# Patient Record
Sex: Female | Born: 1980 | Race: White | Hispanic: No | Marital: Married | Smoking: Never smoker
Health system: Southern US, Community
[De-identification: ages and names within clinical notes are randomized; demographics above are authoritative.]

## PROBLEM LIST (undated history)

## (undated) DIAGNOSIS — Z9882 Breast implant status: Secondary | ICD-10-CM

## (undated) DIAGNOSIS — N189 Chronic kidney disease, unspecified: Secondary | ICD-10-CM

## (undated) DIAGNOSIS — R87610 Atypical squamous cells of undetermined significance on cytologic smear of cervix (ASC-US): Secondary | ICD-10-CM

## (undated) DIAGNOSIS — N200 Calculus of kidney: Secondary | ICD-10-CM

## (undated) DIAGNOSIS — Z9889 Other specified postprocedural states: Secondary | ICD-10-CM

## (undated) HISTORY — PX: BREAST ENHANCEMENT SURGERY: SHX7

## (undated) HISTORY — PX: LITHOTRIPSY: SUR834

---

## 2001-10-20 ENCOUNTER — Other Ambulatory Visit: Admission: RE | Admit: 2001-10-20 | Discharge: 2001-10-20 | Payer: Self-pay | Admitting: Obstetrics and Gynecology

## 2002-11-14 ENCOUNTER — Other Ambulatory Visit: Admission: RE | Admit: 2002-11-14 | Discharge: 2002-11-14 | Payer: Self-pay | Admitting: Obstetrics and Gynecology

## 2004-03-25 ENCOUNTER — Other Ambulatory Visit: Admission: RE | Admit: 2004-03-25 | Discharge: 2004-03-25 | Payer: Self-pay | Admitting: Obstetrics and Gynecology

## 2005-04-22 ENCOUNTER — Other Ambulatory Visit: Admission: RE | Admit: 2005-04-22 | Discharge: 2005-04-22 | Payer: Self-pay | Admitting: Obstetrics and Gynecology

## 2008-04-14 DIAGNOSIS — R87610 Atypical squamous cells of undetermined significance on cytologic smear of cervix (ASC-US): Secondary | ICD-10-CM

## 2008-04-14 HISTORY — PX: OTHER SURGICAL HISTORY: SHX169

## 2008-04-14 HISTORY — DX: Atypical squamous cells of undetermined significance on cytologic smear of cervix (ASC-US): R87.610

## 2008-06-22 ENCOUNTER — Ambulatory Visit: Payer: Self-pay | Admitting: Advanced Practice Midwife

## 2008-06-22 ENCOUNTER — Inpatient Hospital Stay (HOSPITAL_COMMUNITY): Admission: AD | Admit: 2008-06-22 | Discharge: 2008-06-22 | Payer: Self-pay | Admitting: Obstetrics and Gynecology

## 2008-07-06 ENCOUNTER — Inpatient Hospital Stay (HOSPITAL_COMMUNITY): Admission: AD | Admit: 2008-07-06 | Discharge: 2008-07-06 | Payer: Self-pay | Admitting: Obstetrics and Gynecology

## 2008-07-06 ENCOUNTER — Inpatient Hospital Stay (HOSPITAL_COMMUNITY): Admission: AD | Admit: 2008-07-06 | Discharge: 2008-07-09 | Payer: Self-pay | Admitting: Obstetrics and Gynecology

## 2008-08-28 ENCOUNTER — Encounter: Admission: RE | Admit: 2008-08-28 | Discharge: 2008-08-28 | Payer: Self-pay | Admitting: Obstetrics and Gynecology

## 2009-01-11 ENCOUNTER — Ambulatory Visit (HOSPITAL_COMMUNITY): Admission: RE | Admit: 2009-01-11 | Discharge: 2009-01-11 | Payer: Self-pay | Admitting: Urology

## 2010-07-25 LAB — CBC
MCHC: 33.8 g/dL (ref 30.0–36.0)
MCV: 91.1 fL (ref 78.0–100.0)
Platelets: 153 10*3/uL (ref 150–400)
Platelets: 178 10*3/uL (ref 150–400)
RDW: 13.5 % (ref 11.5–15.5)
RDW: 13.9 % (ref 11.5–15.5)

## 2010-07-25 LAB — RPR: RPR Ser Ql: NONREACTIVE

## 2010-11-19 ENCOUNTER — Other Ambulatory Visit (HOSPITAL_COMMUNITY): Payer: Self-pay | Admitting: Urology

## 2010-11-19 DIAGNOSIS — N3020 Other chronic cystitis without hematuria: Secondary | ICD-10-CM

## 2010-11-25 ENCOUNTER — Other Ambulatory Visit (HOSPITAL_COMMUNITY): Payer: Self-pay

## 2010-12-22 IMAGING — CR DG ABDOMEN 1V
1 series · 1 of 1 positions shown · non-contrast
Comparison: None.

CLINICAL DATA: 28-year-old female with right renal stone and
planned lithotripsy.

ABDOMEN - 1 VIEW

[t abdomen supine]
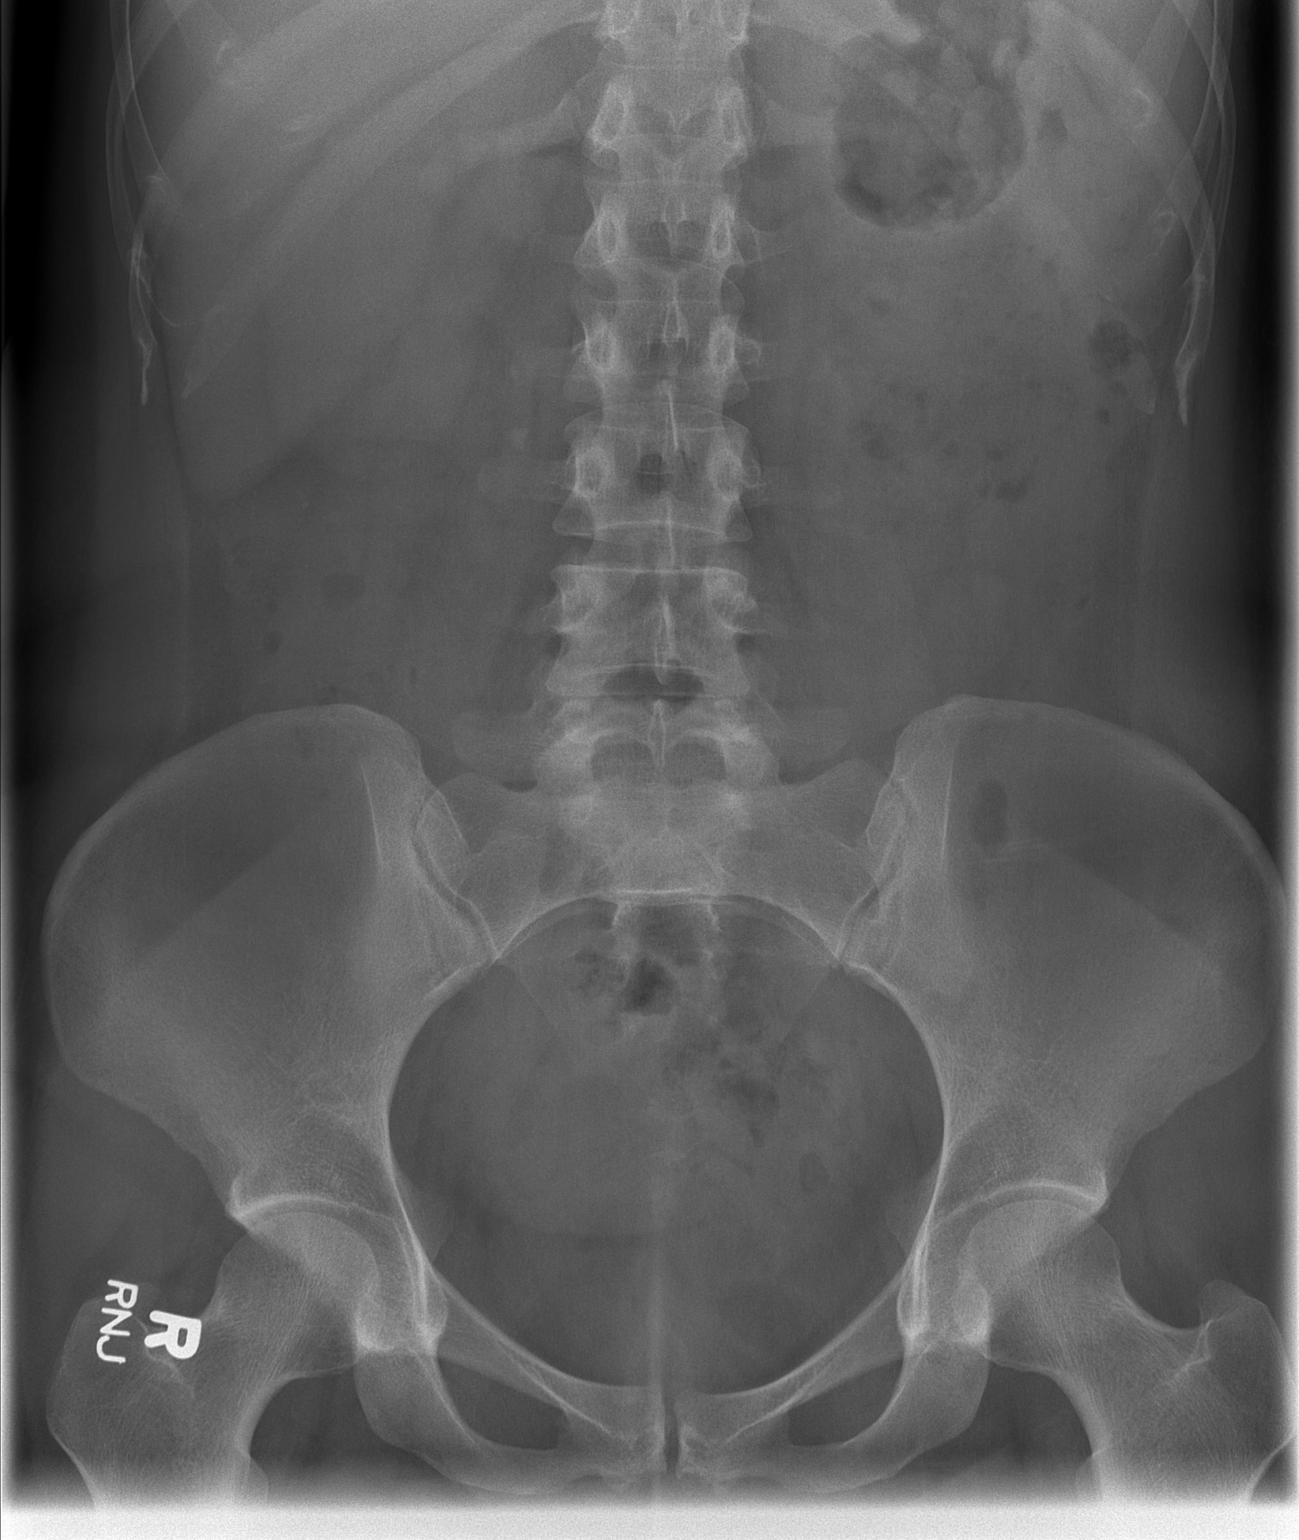

[1 of 1 positions shown; findings below may reference images not displayed]

FINDINGS: AP view of the lower abdomen pelvis.  There is a 5 mm
calcific focus along the course of the right proximal ureter
projecting at the L2-L3 level.  Left renal shadow not well
visualized.  No definite calculus projecting over the right renal
shadow. Visualized bowel gas pattern is nonobstructed.  No acute
osseous abnormality identified.
IMPRESSION: 5 mm proximal right ureteral calculus suspected, at the L2-L3
level.

## 2012-02-10 ENCOUNTER — Other Ambulatory Visit: Payer: Self-pay | Admitting: Urology

## 2012-02-11 ENCOUNTER — Encounter (HOSPITAL_COMMUNITY): Payer: Self-pay | Admitting: Pharmacy Technician

## 2012-02-11 ENCOUNTER — Encounter (HOSPITAL_COMMUNITY): Payer: Self-pay | Admitting: *Deleted

## 2012-02-13 NOTE — H&P (Signed)
History of Present Illness  Meredith Gillespie presents today as an acute work an appointment. Patient has a known 6 mm right renal calculus. This has increased in size and was recently reassessed several weeks ago. We talked her at that time about elective lithotripsy which she is considering. She is now coming in with some intermittent discomfort on that side as well as some intermittent gross hematuria.   Surgical History Problems  1. History of  Lithotripsy  Current Meds 1. Fluticasone Propionate 50 MCG/ACT Nasal Suspension; Therapy: 03Sep2013 to 2. Hydrocodone-Acetaminophen 5-500 MG Oral Tablet; 1-2 TABS PO Q 4-6H PRN PAIN; Therapy:  16Apr2013 to (Last Rx:16Apr2013)  Allergies Medication  1. No Known Drug Allergies  Family History Problems  1. Maternal grandfather's history of  Acute Myocardial Infarction V17.3 2. Maternal grandfather's history of  Chronic Renal Failure 3. Maternal aunt's history of  Colon Cancer V16.0 4. Family history of  Family Health Status Number Of Children 1 daughter (07-07-2008) 5. Paternal history of  Gout V18.19 6. Maternal grandfather's history of  Hypertension V17.49 7. Maternal grandfather's history of  Ischemic Stroke V17.1 8. Paternal history of  Nephrolithiasis 9. Maternal grandmother's history of  Urologic Disorder V18.7 kidney stons, blood in urine  Social History Problems  1. Activities Of Daily Living 2. Alcohol Use 0-1 3. Caffeine Use 3 4. Exercise Habits Has just met with a personal trainer and will be exercising at the gym a couple of times a week and he is doing Zumba once a week.She will be working with a Systems analyst a couple of times a week and does Zumba 1x a weekf about once a week 5. Living Independently With Spouse 6. Marital History - Currently Married 7. Never A Smoker 8. Occupation: Social research officer, government 9. Self-reliant In Usual Daily Activities Denied  10. Tobacco Use  Review of Systems Genitourinary, constitutional, skin,  eye, otolaryngeal, hematologic/lymphatic, cardiovascular, pulmonary, endocrine, musculoskeletal, gastrointestinal, neurological and psychiatric system(s) were reviewed and pertinent findings if present are noted.  Genitourinary: incontinence and hematuria.  Gastrointestinal: nausea and abdominal pain, but no flank pain.  Neurological: headache.    Vitals Vital Signs [Data Includes: Last 1 Day]  25Oct2013 10:57AM  Blood Pressure: 114 / 76 Temperature: 98.4 F Heart Rate: 75  Physical Exam Constitutional: Well nourished and well developed . No acute distress.  Pulmonary: No respiratory distress and normal respiratory rhythm and effort.  Cardiovascular: Heart rate and rhythm are normal . No peripheral edema.  Abdomen: The abdomen is soft and nontender. No masses are palpated. No CVA tenderness. No hernias are palpable. No hepatosplenomegaly noted.  Skin: Normal skin turgor, no visible rash and no visible skin lesions.    Results/Data Urine [Data Includes: Last 1 Day]   25Oct2013  COLOR YELLOW   APPEARANCE CLEAR   SPECIFIC GRAVITY <1.005   pH 6.5   GLUCOSE NEG mg/dL  BILIRUBIN NEG   KETONE NEG mg/dL  BLOOD LARGE   PROTEIN NEG mg/dL  UROBILINOGEN 0.2 mg/dL  NITRITE NEG   LEUKOCYTE ESTERASE NEG   SQUAMOUS EPITHELIAL/HPF NONE SEEN   WBC NONE SEEN WBC/hpf  RBC 3-6 RBC/hpf  BACTERIA NONE SEEN   CRYSTALS NONE SEEN   CASTS NONE SEEN     KUB was obtained today. This does show migration of her stone into the proximal ureter. On measurement today it appears to be 4 x 6 mm really in the early part of the proximal ureter.   Assessment Assessed  1. Nephrolithiasis 592.0 2. Ureteral Stone 592.1  Plan Health Maintenance (V70.0)  1. UA With REFLEX  Done: 25Oct2013 10:50AM Nephrolithiasis (592.0)  2. KUB  Done: 25Oct2013 12:00AM  Discussion/Summary  Meredith Gillespie has a 4 x 6 mm proximal right ureteral calculus. This is her known stone that has migrated. She is told that she has  approximately a 5050 chance of spontaneous passage. There is early no indication for acute intervention at this time. She is interested in attempts at spontaneous passage. She does have pain medication at home. We will also get her started on tamsulosin. We will have her follow up with a physician extenders in about 2 weeks for a KUB. If treatment is necessary I would recommend ESWL of the stone remains in relatively the same position. If the stone does migrate more distally and is more difficult to visualize and ureteroscopy may be necessary.

## 2012-02-16 ENCOUNTER — Ambulatory Visit (HOSPITAL_COMMUNITY)
Admission: RE | Admit: 2012-02-16 | Discharge: 2012-02-16 | Disposition: A | Discharge status: Home / Self Care | Payer: BC Managed Care – PPO | Source: Ambulatory Visit | Attending: Urology | Admitting: Urology

## 2012-02-16 ENCOUNTER — Ambulatory Visit (HOSPITAL_COMMUNITY): Payer: BC Managed Care – PPO

## 2012-02-16 ENCOUNTER — Encounter (HOSPITAL_COMMUNITY)
Admission: RE | Disposition: A | Discharge status: Home / Self Care | Payer: Self-pay | Source: Ambulatory Visit | Attending: Urology

## 2012-02-16 DIAGNOSIS — N200 Calculus of kidney: Secondary | ICD-10-CM | POA: Insufficient documentation

## 2012-02-16 DIAGNOSIS — N201 Calculus of ureter: Secondary | ICD-10-CM

## 2012-02-16 HISTORY — DX: Chronic kidney disease, unspecified: N18.9

## 2012-02-16 SURGERY — LITHOTRIPSY, ESWL
Anesthesia: LOCAL | Laterality: Right

## 2012-02-16 MED ORDER — DIPHENHYDRAMINE HCL 25 MG PO CAPS
25.0000 mg | ORAL_CAPSULE | ORAL | Status: AC
  Administered 2012-02-16: 25 mg via ORAL
  Filled 2012-02-16: qty 1

## 2012-02-16 MED ORDER — CIPROFLOXACIN IN D5W 400 MG/200ML IV SOLN
400.0000 mg | INTRAVENOUS | Status: AC
  Administered 2012-02-16: 400 mg via INTRAVENOUS
  Filled 2012-02-16: qty 200

## 2012-02-16 MED ORDER — DEXTROSE-NACL 5-0.45 % IV SOLN
INTRAVENOUS | Status: DC
  Administered 2012-02-16: 08:00:00 via INTRAVENOUS

## 2012-02-16 MED ORDER — DIAZEPAM 5 MG PO TABS
10.0000 mg | ORAL_TABLET | ORAL | Status: AC
  Administered 2012-02-16: 10 mg via ORAL
  Filled 2012-02-16: qty 2

## 2012-02-16 NOTE — Interval H&P Note (Signed)
History and Physical Interval Note:  02/16/2012 8:24 AM  Meredith Gillespie  has presented today for surgery, with the diagnosis of Right Ureteral Calculus  The various methods of treatment have been discussed with the patient and family. After consideration of risks, benefits and other options for treatment, the patient has consented to  Procedure(s) (LRB) with comments: EXTRACORPOREAL SHOCK WAVE LITHOTRIPSY (ESWL) (Right) as a surgical intervention .  The patient's history has been reviewed, patient examined, no change in status, stable for surgery.  I have reviewed the patient's chart and labs.  Questions were answered to the patient's satisfaction.     [name]

## 2012-02-16 NOTE — Op Note (Signed)
See [facility] OP note scanned into chart. 

## 2012-02-27 ENCOUNTER — Other Ambulatory Visit: Payer: Self-pay | Admitting: Obstetrics and Gynecology

## 2012-02-27 DIAGNOSIS — Z9882 Breast implant status: Secondary | ICD-10-CM

## 2012-02-27 DIAGNOSIS — N644 Mastodynia: Secondary | ICD-10-CM

## 2012-03-03 ENCOUNTER — Ambulatory Visit
Admission: RE | Admit: 2012-03-03 | Discharge: 2012-03-03 | Disposition: A | Discharge status: Home / Self Care | Payer: BC Managed Care – PPO | Source: Ambulatory Visit | Attending: Obstetrics and Gynecology | Admitting: Obstetrics and Gynecology

## 2012-03-03 ENCOUNTER — Other Ambulatory Visit: Payer: Self-pay | Admitting: Obstetrics and Gynecology

## 2012-03-03 DIAGNOSIS — Z9882 Breast implant status: Secondary | ICD-10-CM

## 2012-03-03 DIAGNOSIS — N644 Mastodynia: Secondary | ICD-10-CM

## 2013-10-04 LAB — OB RESULTS CONSOLE RUBELLA ANTIBODY, IGM: Rubella: IMMUNE

## 2013-10-04 LAB — OB RESULTS CONSOLE ABO/RH: RH Type: POSITIVE

## 2013-10-04 LAB — OB RESULTS CONSOLE HIV ANTIBODY (ROUTINE TESTING): HIV: NONREACTIVE

## 2013-10-04 LAB — OB RESULTS CONSOLE GC/CHLAMYDIA
Chlamydia: NEGATIVE
GC PROBE AMP, GENITAL: NEGATIVE

## 2013-10-04 LAB — OB RESULTS CONSOLE ANTIBODY SCREEN: Antibody Screen: NEGATIVE

## 2013-10-04 LAB — OB RESULTS CONSOLE RPR: RPR: NONREACTIVE

## 2013-10-04 LAB — OB RESULTS CONSOLE HEPATITIS B SURFACE ANTIGEN: HEP B S AG: NEGATIVE

## 2014-01-26 IMAGING — CR DG ABDOMEN 1V
1 series · 1 of 1 positions shown · non-contrast
Comparison: 02/06/2012

CLINICAL DATA: Right renal stone, pre lithotripsy

ABDOMEN - 1 VIEW

[t abdomen supine]
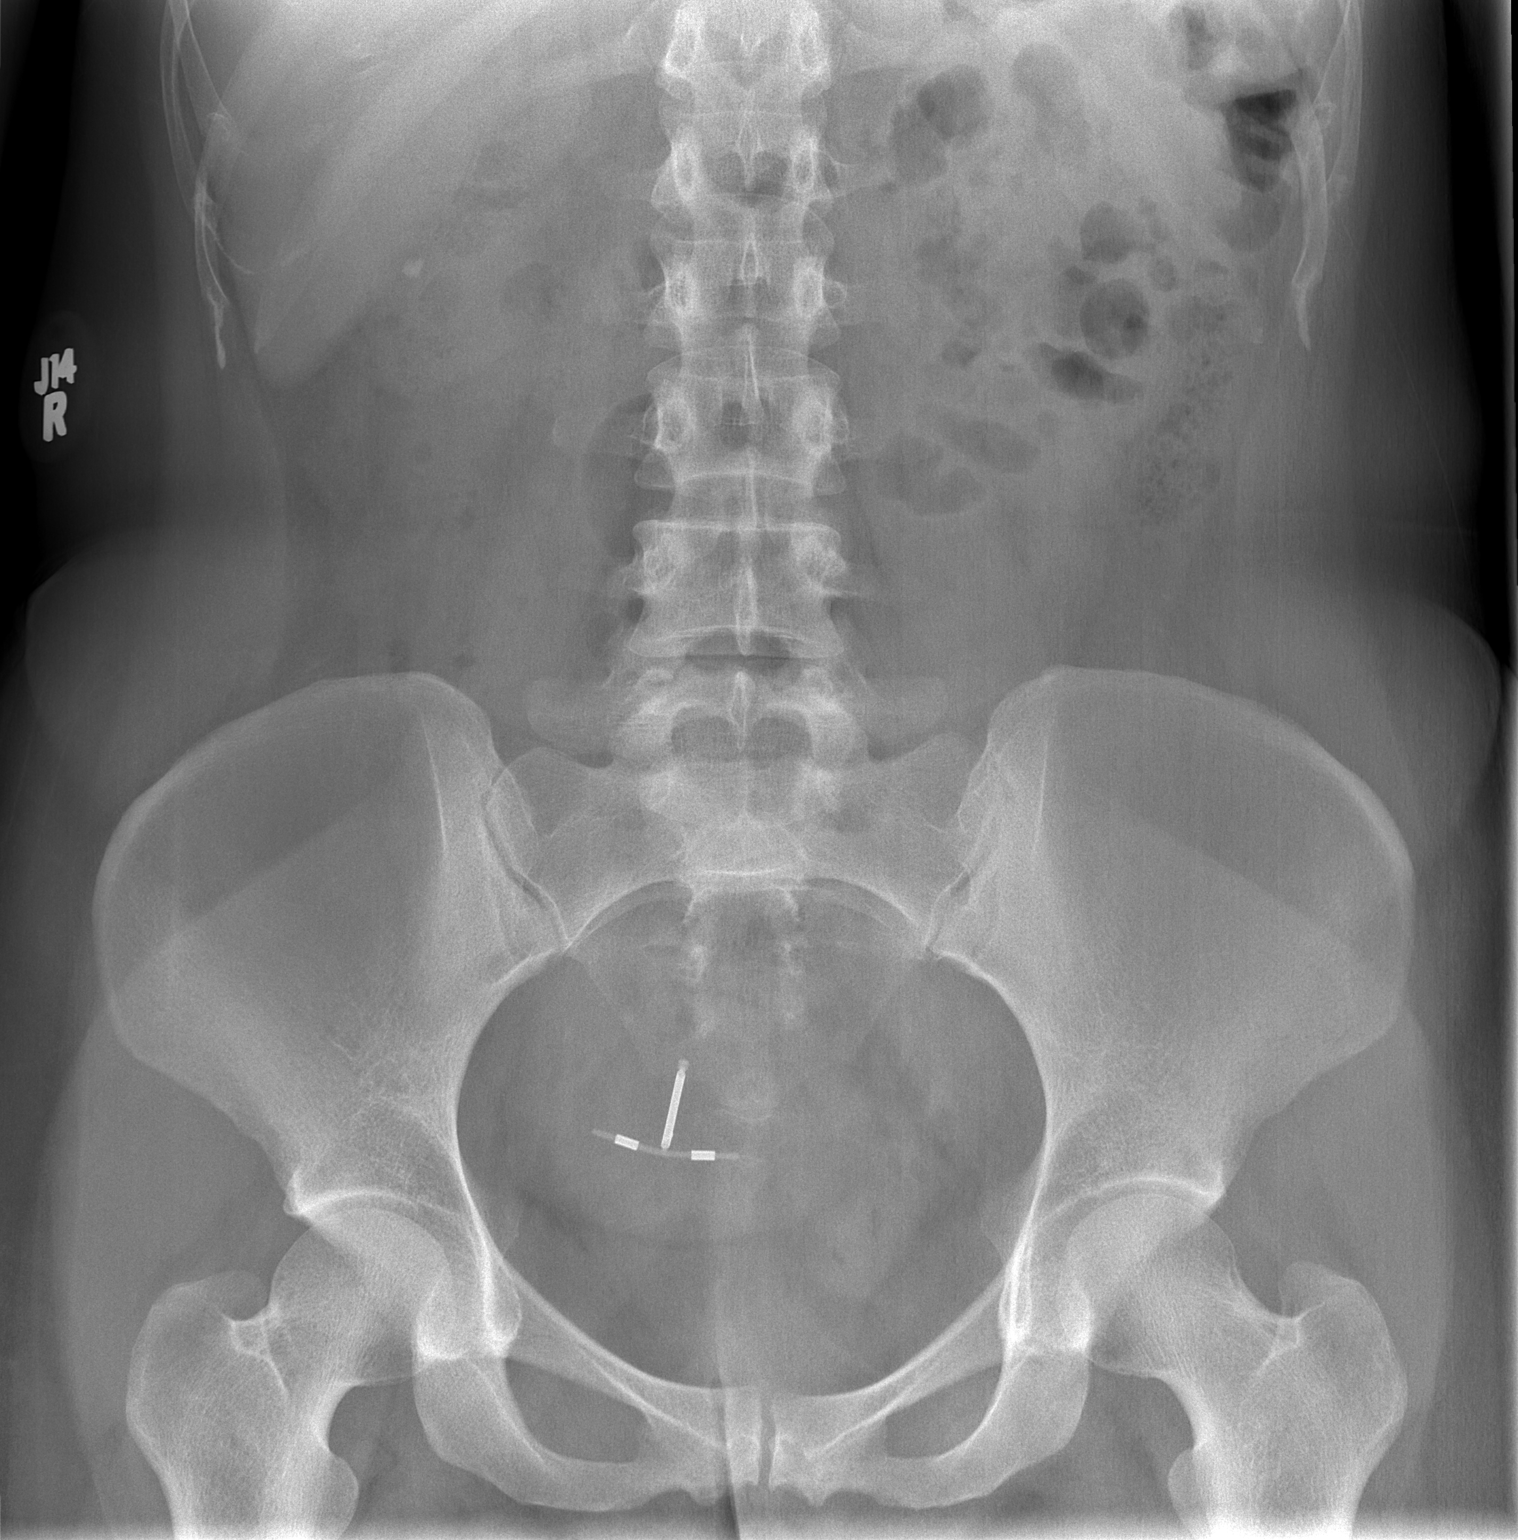

[1 of 1 positions shown; findings below may reference images not displayed]

FINDINGS: 6 mm stone projects over the right renal shadow.  Bowel
gas pattern nonobstructive.  Hemidiaphragms excluded from the
image.  IUD projects over the pelvis.  No acute osseous finding.
IMPRESSION: 6 mm stone projects over the right renal shadow.

## 2014-03-10 ENCOUNTER — Inpatient Hospital Stay (HOSPITAL_COMMUNITY)
Admission: AD | Admit: 2014-03-10 | Discharge: 2014-03-10 | Disposition: A | Discharge status: Home / Self Care | Payer: Managed Care, Other (non HMO) | Source: Ambulatory Visit | Attending: Obstetrics and Gynecology | Admitting: Obstetrics and Gynecology

## 2014-03-10 ENCOUNTER — Encounter (HOSPITAL_COMMUNITY): Payer: Self-pay | Admitting: *Deleted

## 2014-03-10 DIAGNOSIS — O368130 Decreased fetal movements, third trimester, not applicable or unspecified: Secondary | ICD-10-CM

## 2014-03-10 DIAGNOSIS — Z3A30 30 weeks gestation of pregnancy: Secondary | ICD-10-CM | POA: Diagnosis not present

## 2014-03-10 DIAGNOSIS — O368123 Decreased fetal movements, second trimester, fetus 3: Secondary | ICD-10-CM | POA: Diagnosis present

## 2014-03-10 DIAGNOSIS — O368132 Decreased fetal movements, third trimester, fetus 2: Secondary | ICD-10-CM

## 2014-03-10 HISTORY — DX: Calculus of kidney: N20.0

## 2014-03-10 LAB — URINALYSIS, ROUTINE W REFLEX MICROSCOPIC
BILIRUBIN URINE: NEGATIVE
GLUCOSE, UA: NEGATIVE mg/dL
Ketones, ur: NEGATIVE mg/dL
Leukocytes, UA: NEGATIVE
NITRITE: NEGATIVE
PH: 6 (ref 5.0–8.0)
Protein, ur: NEGATIVE mg/dL
SPECIFIC GRAVITY, URINE: 1.02 (ref 1.005–1.030)
Urobilinogen, UA: 0.2 mg/dL (ref 0.0–1.0)

## 2014-03-10 LAB — URINE MICROSCOPIC-ADD ON

## 2014-03-10 NOTE — MAU Note (Signed)
Bleeding after intercourse, small amt of pinkish fluid.  (no hx of low lying or previa) Was told she was anemic last wk, has not started her iron yet.  Pt is feeling weak and dizzy.  Decreased fetal movement noted today..Marland Kitchen

## 2014-03-10 NOTE — Discharge Instructions (Signed)
Fetal Movement Counts °Patient Name: __________________________________________________ Patient Due Date: ____________________ °Performing a fetal movement count is highly recommended in high-risk pregnancies, but it is good for every pregnant woman to do. Your health care provider may ask you to start counting fetal movements at 28 weeks of the pregnancy. Fetal movements often increase: °· After eating a full meal. °· After physical activity. °· After eating or drinking something sweet or cold. °· At rest. °Pay attention to when you feel the baby is most active. This will help you notice a pattern of your baby's sleep and wake cycles and what factors contribute to an increase in fetal movement. It is important to perform a fetal movement count at the same time each day when your baby is normally most active.  °HOW TO COUNT FETAL MOVEMENTS °· Find a quiet and comfortable area to sit or lie down on your left side. Lying on your left side provides the best blood and oxygen circulation to your baby. °· Write down the day and time on a sheet of paper or in a journal. °· Start counting kicks, flutters, swishes, rolls, or jabs in a 2-hour period. You should feel at least 10 movements within 2 hours. °· If you do not feel 10 movements in 2 hours, wait 2-3 hours and count again. Look for a change in the pattern or not enough counts in 2 hours. °SEEK MEDICAL CARE IF: °· You feel less than 10 counts in 2 hours, tried twice. °· There is no movement in over an hour. °· The pattern is changing or taking longer each day to reach 10 counts in 2 hours. °· You feel the baby is not moving as he or she usually does. °Date: ____________ Movements: ____________ Start time: ____________ Finish time: ____________  °Date: ____________ Movements: ____________ Start time: ____________ Finish time: ____________ °Date: ____________ Movements: ____________ Start time: ____________ Finish time: ____________ °Date: ____________ Movements:  ____________ Start time: ____________ Finish time: ____________ °Date: ____________ Movements: ____________ Start time: ____________ Finish time: ____________ °Date: ____________ Movements: ____________ Start time: ____________ Finish time: ____________ °Date: ____________ Movements: ____________ Start time: ____________ Finish time: ____________ °Date: ____________ Movements: ____________ Start time: ____________ Finish time: ____________  °Date: ____________ Movements: ____________ Start time: ____________ Finish time: ____________ °Date: ____________ Movements: ____________ Start time: ____________ Finish time: ____________ °Date: ____________ Movements: ____________ Start time: ____________ Finish time: ____________ °Date: ____________ Movements: ____________ Start time: ____________ Finish time: ____________ °Date: ____________ Movements: ____________ Start time: ____________ Finish time: ____________ °Date: ____________ Movements: ____________ Start time: ____________ Finish time: ____________ °Date: ____________ Movements: ____________ Start time: ____________ Finish time: ____________  °Date: ____________ Movements: ____________ Start time: ____________ Finish time: ____________ °Date: ____________ Movements: ____________ Start time: ____________ Finish time: ____________ °Date: ____________ Movements: ____________ Start time: ____________ Finish time: ____________ °Date: ____________ Movements: ____________ Start time: ____________ Finish time: ____________ °Date: ____________ Movements: ____________ Start time: ____________ Finish time: ____________ °Date: ____________ Movements: ____________ Start time: ____________ Finish time: ____________ °Date: ____________ Movements: ____________ Start time: ____________ Finish time: ____________  °Date: ____________ Movements: ____________ Start time: ____________ Finish time: ____________ °Date: ____________ Movements: ____________ Start time: ____________ Finish  time: ____________ °Date: ____________ Movements: ____________ Start time: ____________ Finish time: ____________ °Date: ____________ Movements: ____________ Start time: ____________ Finish time: ____________ °Date: ____________ Movements: ____________ Start time: ____________ Finish time: ____________ °Date: ____________ Movements: ____________ Start time: ____________ Finish time: ____________ °Date: ____________ Movements: ____________ Start time: ____________ Finish time: ____________  °Date: ____________ Movements: ____________ Start time: ____________ Finish   time: ____________ °Date: ____________ Movements: ____________ Start time: ____________ Finish time: ____________ °Date: ____________ Movements: ____________ Start time: ____________ Finish time: ____________ °Date: ____________ Movements: ____________ Start time: ____________ Finish time: ____________ °Date: ____________ Movements: ____________ Start time: ____________ Finish time: ____________ °Date: ____________ Movements: ____________ Start time: ____________ Finish time: ____________ °Date: ____________ Movements: ____________ Start time: ____________ Finish time: ____________  °Date: ____________ Movements: ____________ Start time: ____________ Finish time: ____________ °Date: ____________ Movements: ____________ Start time: ____________ Finish time: ____________ °Date: ____________ Movements: ____________ Start time: ____________ Finish time: ____________ °Date: ____________ Movements: ____________ Start time: ____________ Finish time: ____________ °Date: ____________ Movements: ____________ Start time: ____________ Finish time: ____________ °Date: ____________ Movements: ____________ Start time: ____________ Finish time: ____________ °Date: ____________ Movements: ____________ Start time: ____________ Finish time: ____________  °Date: ____________ Movements: ____________ Start time: ____________ Finish time: ____________ °Date: ____________  Movements: ____________ Start time: ____________ Finish time: ____________ °Date: ____________ Movements: ____________ Start time: ____________ Finish time: ____________ °Date: ____________ Movements: ____________ Start time: ____________ Finish time: ____________ °Date: ____________ Movements: ____________ Start time: ____________ Finish time: ____________ °Date: ____________ Movements: ____________ Start time: ____________ Finish time: ____________ °Date: ____________ Movements: ____________ Start time: ____________ Finish time: ____________  °Date: ____________ Movements: ____________ Start time: ____________ Finish time: ____________ °Date: ____________ Movements: ____________ Start time: ____________ Finish time: ____________ °Date: ____________ Movements: ____________ Start time: ____________ Finish time: ____________ °Date: ____________ Movements: ____________ Start time: ____________ Finish time: ____________ °Date: ____________ Movements: ____________ Start time: ____________ Finish time: ____________ °Date: ____________ Movements: ____________ Start time: ____________ Finish time: ____________ °Document Released: 04/30/2006 Document Revised: 08/15/2013 Document Reviewed: 01/26/2012 °[employer]® Patient Information ©2015 [employer]. This information is not intended to replace advice given to you by your health care provider. Make sure you discuss any questions you have with your health care provider. °Third Trimester of Pregnancy °The third trimester is from week 29 through week 42, months 7 through 9. The third trimester is a time when the fetus is growing rapidly. At the end of the ninth month, the fetus is about 20 inches in length and weighs 6-10 pounds.  °BODY CHANGES °Your body goes through many changes during pregnancy. The changes vary from woman to woman.  °· Your weight will continue to increase. You can expect to gain 25-35 pounds (11-16 kg) by the end of the pregnancy. °· You may begin to get  stretch marks on your hips, abdomen, and breasts. °· You may urinate more often because the fetus is moving lower into your pelvis and pressing on your bladder. °· You may develop or continue to have heartburn as a result of your pregnancy. °· You may develop constipation because certain hormones are causing the muscles that push waste through your intestines to slow down. °· You may develop hemorrhoids or swollen, bulging veins (varicose veins). °· You may have pelvic pain because of the weight gain and pregnancy hormones relaxing your joints between the bones in your pelvis. Backaches may result from overexertion of the muscles supporting your posture. °· You may have changes in your hair. These can include thickening of your hair, rapid growth, and changes in texture. Some women also have hair loss during or after pregnancy, or hair that feels dry or thin. Your hair will most likely return to normal after your baby is born. °· Your breasts will continue to grow and be tender. A yellow discharge may leak from your breasts called colostrum. °· Your belly button may stick out. °· You may feel short of   breath because of your expanding uterus. °· You may notice the fetus "dropping," or moving lower in your abdomen. °· You may have a bloody mucus discharge. This usually occurs a few days to a week before labor begins. °· Your cervix becomes thin and soft (effaced) near your due date. °WHAT TO EXPECT AT YOUR PRENATAL EXAMS  °You will have prenatal exams every 2 weeks until week 36. Then, you will have weekly prenatal exams. During a routine prenatal visit: °· You will be weighed to make sure you and the fetus are growing normally. °· Your blood pressure is taken. °· Your abdomen will be measured to track your baby's growth. °· The fetal heartbeat will be listened to. °· Any test results from the previous visit will be discussed. °· You may have a cervical check near your due date to see if you have effaced. °At around  36 weeks, your caregiver will check your cervix. At the same time, your caregiver will also perform a test on the secretions of the vaginal tissue. This test is to determine if a type of bacteria, Group B streptococcus, is present. Your caregiver will explain this further. °Your caregiver may ask you: °· What your birth plan is. °· How you are feeling. °· If you are feeling the baby move. °· If you have had any abnormal symptoms, such as leaking fluid, bleeding, severe headaches, or abdominal cramping. °· If you have any questions. °Other tests or screenings that may be performed during your third trimester include: °· Blood tests that check for low iron levels (anemia). °· Fetal testing to check the health, activity level, and growth of the fetus. Testing is done if you have certain medical conditions or if there are problems during the pregnancy. °FALSE LABOR °You may feel small, irregular contractions that eventually go away. These are called Braxton Hicks contractions, or false labor. Contractions may last for hours, days, or even weeks before true labor sets in. If contractions come at regular intervals, intensify, or become painful, it is best to be seen by your caregiver.  °SIGNS OF LABOR  °· Menstrual-like cramps. °· Contractions that are 5 minutes apart or less. °· Contractions that start on the top of the uterus and spread down to the lower abdomen and back. °· A sense of increased pelvic pressure or back pain. °· A watery or bloody mucus discharge that comes from the vagina. °If you have any of these signs before the 37th week of pregnancy, call your caregiver right away. You need to go to the hospital to get checked immediately. °HOME CARE INSTRUCTIONS  °· Avoid all smoking, herbs, alcohol, and unprescribed drugs. These chemicals affect the formation and growth of the baby. °· Follow your caregiver's instructions regarding medicine use. There are medicines that are either safe or unsafe to take during  pregnancy. °· Exercise only as directed by your caregiver. Experiencing uterine cramps is a good sign to stop exercising. °· Continue to eat regular, healthy meals. °· Wear a good support bra for breast tenderness. °· Do not use hot tubs, steam rooms, or saunas. °· Wear your seat belt at all times when driving. °· Avoid raw meat, uncooked cheese, cat litter boxes, and soil used by cats. These carry germs that can cause birth defects in the baby. °· Take your prenatal vitamins. °· Try taking a stool softener (if your caregiver approves) if you develop constipation. Eat more high-fiber foods, such as fresh vegetables or fruit and whole grains. Drink   plenty of fluids to keep your urine clear or pale yellow. °· Take warm sitz baths to soothe any pain or discomfort caused by hemorrhoids. Use hemorrhoid cream if your caregiver approves. °· If you develop varicose veins, wear support hose. Elevate your feet for 15 minutes, 3-4 times a day. Limit salt in your diet. °· Avoid heavy lifting, wear low heal shoes, and practice good posture. °· Rest a lot with your legs elevated if you have leg cramps or low back pain. °· Visit your dentist if you have not gone during your pregnancy. Use a soft toothbrush to brush your teeth and be gentle when you floss. °· A sexual relationship may be continued unless your caregiver directs you otherwise. °· Do not travel far distances unless it is absolutely necessary and only with the approval of your caregiver. °· Take prenatal classes to understand, practice, and ask questions about the labor and delivery. °· Make a trial run to the hospital. °· Pack your hospital bag. °· Prepare the baby's nursery. °· Continue to go to all your prenatal visits as directed by your caregiver. °SEEK MEDICAL CARE IF: °· You are unsure if you are in labor or if your water has broken. °· You have dizziness. °· You have mild pelvic cramps, pelvic pressure, or nagging pain in your abdominal area. °· You have  persistent nausea, vomiting, or diarrhea. °· You have a bad smelling vaginal discharge. °· You have pain with urination. °SEEK IMMEDIATE MEDICAL CARE IF:  °· You have a fever. °· You are leaking fluid from your vagina. °· You have spotting or bleeding from your vagina. °· You have severe abdominal cramping or pain. °· You have rapid weight loss or gain. °· You have shortness of breath with chest pain. °· You notice sudden or extreme swelling of your face, hands, ankles, feet, or legs. °· You have not felt your baby move in over an hour. °· You have severe headaches that do not go away with medicine. °· You have vision changes. °Document Released: 03/25/2001 Document Revised: 04/05/2013 Document Reviewed: 06/01/2012 °[employer]® Patient Information ©2015 [employer]. This information is not intended to replace advice given to you by your health care provider. Make sure you discuss any questions you have with your health care provider. ° ° °

## 2014-03-10 NOTE — MAU Provider Note (Signed)
  History     CSN: [id number]  Arrival date and time: 03/10/14 1234   First Provider Initiated Contact with Patient 03/10/14 1357      Chief Complaint  Patient presents with  . Decreased Fetal Movement   HPI Comments: Meredith Gillespie 33 y.o. G2P1 6834w2d presenst to MAU with several complaints including decreased fetal movement, pinks vaginal discharge and feeling tired. She had intercourse last night and this morning noticed discharge. She does not have any issues with her placenta. She has a history of anemia and this week was put on iron tablets.      Past Medical History  Diagnosis Date  . Chronic kidney disease     kidney stones  . Kidney stones     Past Surgical History  Procedure Laterality Date  . Breast enhancement surgery    . Childbirth  2010  . Lithotripsy  2000, 2010    Family History  Problem Relation Age of Onset  . Cancer Maternal Aunt     History  Substance Use Topics  . Smoking status: Never Smoker   . Smokeless tobacco: Not on file  . Alcohol Use: Yes    Allergies: No Known Allergies  Prescriptions prior to admission  Medication Sig Dispense Refill Last Dose  . acetaminophen (TYLENOL) 500 MG tablet Take 1,000 mg by mouth every 6 (six) hours as needed for headache.   Past Week at Unknown time  . calcium carbonate (TUMS - DOSED IN MG ELEMENTAL CALCIUM) 500 MG chewable tablet Chew 2 tablets by mouth 2 (two) times daily as needed for indigestion or heartburn.   Past Week at Unknown time  . FERROUS SULFATE PO Take 1 tablet by mouth daily.   03/10/2014 at Unknown time  . Prenatal MV-Min-Fe Fum-FA-DHA (PRENATAL+DHA PO) Take 1 tablet by mouth daily.   03/09/2014 at Unknown time    Review of Systems  Constitutional: Positive for malaise/fatigue.  HENT: Negative.   Eyes: Negative.   Cardiovascular: Negative.   Gastrointestinal: Negative.   Genitourinary:       Decreased fetal movement/ pink discharge  Musculoskeletal: Negative.   Skin: Negative.    Neurological: Negative.   Psychiatric/Behavioral: Negative.    Physical Exam   Blood pressure 110/69, pulse 95, temperature 97.6 F (36.4 C), temperature source Oral, resp. rate 16, height 5\' 2"  (1.575 m), weight 77.111 kg (170 lb).  Physical Exam  Constitutional: She is oriented to person, place, and time. She appears well-developed and well-nourished. No distress.  HENT:  Head: Normocephalic and atraumatic.  GI: Soft. She exhibits no distension. There is no tenderness. There is no rebound.  Genitourinary:  Genital: external negative Vaginal: no blood in vault Cervix: not bleeding/ closed Bimanual: pressure with exam   Musculoskeletal: Normal range of motion.  Neurological: She is alert and oriented to person, place, and time.  Skin: Skin is warm and dry.  Psychiatric: She has a normal mood and affect. Her behavior is normal. Judgment and thought content normal.   TOCO: rate 140, reactive, no contractions Called Meredith Gillespie who advised kick counts and return as needed  MAU Course  Procedures  MDM   Assessment and Plan   A: Decreased fetal movement  P: Kick counts starting today Increase fluids/ take iron tabs Rest as needed Follow up as needed  Meredith Gillespie, Meredith Gillespie 03/10/2014, 2:05 PM

## 2014-03-10 NOTE — MAU Note (Signed)
C/o decreased fetal movement since this AM; c/o pinkish discharge (after sex) last night; c/o feeling weak;

## 2014-03-10 NOTE — MAU Note (Signed)
C/o feeling tired and weak: suppose to start some iron pills but has not yet;

## 2014-04-13 LAB — OB RESULTS CONSOLE GBS: STREP GROUP B AG: NEGATIVE

## 2014-04-21 ENCOUNTER — Inpatient Hospital Stay (HOSPITAL_COMMUNITY)
Admission: AD | Admit: 2014-04-21 | Discharge: 2014-04-22 | Disposition: A | Discharge status: Home / Self Care | Payer: Managed Care, Other (non HMO) | Source: Ambulatory Visit | Attending: Obstetrics and Gynecology | Admitting: Obstetrics and Gynecology

## 2014-04-21 DIAGNOSIS — Z3A37 37 weeks gestation of pregnancy: Secondary | ICD-10-CM | POA: Insufficient documentation

## 2014-04-21 DIAGNOSIS — O9989 Other specified diseases and conditions complicating pregnancy, childbirth and the puerperium: Secondary | ICD-10-CM | POA: Insufficient documentation

## 2014-04-21 DIAGNOSIS — N898 Other specified noninflammatory disorders of vagina: Secondary | ICD-10-CM | POA: Insufficient documentation

## 2014-04-21 NOTE — MAU Note (Signed)
While having intercourse at 2200 leaked some fld but has not leaked anymore since then. Could have been urine but unsure. Was told to come in. Occ braxton hicks cts

## 2014-04-22 ENCOUNTER — Encounter (HOSPITAL_COMMUNITY): Payer: Self-pay | Admitting: *Deleted

## 2014-04-22 DIAGNOSIS — O9989 Other specified diseases and conditions complicating pregnancy, childbirth and the puerperium: Secondary | ICD-10-CM | POA: Diagnosis not present

## 2014-04-22 DIAGNOSIS — N898 Other specified noninflammatory disorders of vagina: Secondary | ICD-10-CM | POA: Diagnosis not present

## 2014-04-22 DIAGNOSIS — Z3A37 37 weeks gestation of pregnancy: Secondary | ICD-10-CM | POA: Diagnosis not present

## 2014-04-22 LAB — POCT FERN TEST: POCT FERN TEST: NEGATIVE

## 2014-04-22 NOTE — Progress Notes (Signed)
Written and verbal d/c instructions given and understanding voiced. 

## 2014-04-22 NOTE — MAU Provider Note (Signed)
Subjective:  Meredith Gillespie is a 34 y.o. female G2P1 at 38106w3d who presents with possible ROM. During intercourse this evening around 10 pm she noted a large amount of discharge from the vagina. She was unsure if this was related to intercourse or if she urinated on her self. She wanted to be sure it was not her water that broke. She did not continue to leak on her way to the hospital.   +fetal movements Denies vaginal bleeding Occasional contraction pain.   Objective:  GENERAL: Well-developed, well-nourished female in no acute distress.  HEENT: Normocephalic, atraumatic.   LUNGS: Effort normal SKIN: Warm, dry and without erythema PSYCH: Normal mood and affect  Filed Vitals:   04/21/14 2343  BP: 112/79  Pulse: 106  Temp: 97.9 F (36.6 C)  Resp: 18  Height: 5\' 3"  (1.6 m)  Weight: 83.734 kg (184 lb 9.6 oz)   No results found for this or any previous visit (from the past 48 hour(s)).  Speculum exam: Vagina - Small amount of clear, mucus like discharge, no odor. No pooling of fluid in the vaginal canal  Bimanual exam: Dilation: 1.5 Effacement (%): 50 Exam by:: Venia CarbonJennifer Abdelaziz Westenberger NP Chaperone present for exam.  MDM Fern slide negative Sperm noted on slide.  Assessment:  Vaginal discharge in pregnancy Negative fern slide   Plan:  RN to inform Dr. Vincente PoliGrewal of results  Iona HansenJennifer Irene Raffael Bugarin, NP 04/22/2014 1:59 AM

## 2014-04-22 NOTE — Progress Notes (Signed)
Dr Arelia SneddonMcComb notified of pt's admission and status. Aware of ? SROM after intercourse with negative fern slide and no pooling on spec exam. Cervix unchanged from prior exam and FHR reactive. Pt stable for d/c home

## 2014-04-22 NOTE — Discharge Instructions (Signed)
Braxton Hicks Contractions °Contractions of the uterus can occur throughout pregnancy. Contractions are not always a sign that you are in labor.  °WHAT ARE BRAXTON HICKS CONTRACTIONS?  °Contractions that occur before labor are called Braxton Hicks contractions, or false labor. Toward the end of pregnancy (32-34 weeks), these contractions can develop more often and may become more forceful. This is not true labor because these contractions do not result in opening (dilatation) and thinning of the cervix. They are sometimes difficult to tell apart from true labor because these contractions can be forceful and people have different pain tolerances. You should not feel embarrassed if you go to the hospital with false labor. Sometimes, the only way to tell if you are in true labor is for your health care provider to look for changes in the cervix. °If there are no prenatal problems or other health problems associated with the pregnancy, it is completely safe to be sent home with false labor and await the onset of true labor. °HOW CAN YOU TELL THE DIFFERENCE BETWEEN TRUE AND FALSE LABOR? °False Labor °· The contractions of false labor are usually shorter and not as hard as those of true labor.   °· The contractions are usually irregular.   °· The contractions are often felt in the front of the lower abdomen and in the groin.   °· The contractions may go away when you walk around or change positions while lying down.   °· The contractions get weaker and are shorter lasting as time goes on.   °· The contractions do not usually become progressively stronger, regular, and closer together as with true labor.   °True Labor °· Contractions in true labor last 30-70 seconds, become very regular, usually become more intense, and increase in frequency.   °· The contractions do not go away with walking.   °· The discomfort is usually felt in the top of the uterus and spreads to the lower abdomen and low back.   °· True labor can be  determined by your health care provider with an exam. This will show that the cervix is dilating and getting thinner.   °WHAT TO REMEMBER °· Keep up with your usual exercises and follow other instructions given by your health care provider.   °· Take medicines as directed by your health care provider.   °· Keep your regular prenatal appointments.   °· Eat and drink lightly if you think you are going into labor.   °· If Braxton Hicks contractions are making you uncomfortable:   °¨ Change your position from lying down or resting to walking, or from walking to resting.   °¨ Sit and rest in a tub of warm water.   °¨ Drink 2-3 glasses of water. Dehydration may cause these contractions.   °¨ Do slow and deep breathing several times an hour.   °WHEN SHOULD I SEEK IMMEDIATE MEDICAL CARE? °Seek immediate medical care if: °· Your contractions become stronger, more regular, and closer together.   °· You have fluid leaking or gushing from your vagina.   °· You have a fever.   °· You pass blood-tinged mucus.   °· You have vaginal bleeding.   °· You have continuous abdominal pain.   °· You have low back pain that you never had before.   °· You feel your baby's head pushing down and causing pelvic pressure.   °· Your baby is not moving as much as it used to.   °Document Released: 03/31/2005 Document Revised: 04/05/2013 Document Reviewed: 01/10/2013 °[employer]® Patient Information ©2015 [employer]. This information is not intended to replace advice given to you by your health care   provider. Make sure you discuss any questions you have with your health care provider. ° °

## 2014-05-02 ENCOUNTER — Inpatient Hospital Stay (HOSPITAL_COMMUNITY)
Admission: AD | Admit: 2014-05-02 | Discharge: 2014-05-04 | DRG: 766 | Disposition: A | Discharge status: Home / Self Care | Payer: Managed Care, Other (non HMO) | Source: Ambulatory Visit | Attending: Obstetrics & Gynecology | Admitting: Obstetrics & Gynecology

## 2014-05-02 ENCOUNTER — Inpatient Hospital Stay (HOSPITAL_COMMUNITY): Payer: Managed Care, Other (non HMO) | Admitting: Anesthesiology

## 2014-05-02 ENCOUNTER — Encounter (HOSPITAL_COMMUNITY): Payer: Self-pay | Admitting: *Deleted

## 2014-05-02 ENCOUNTER — Encounter (HOSPITAL_COMMUNITY)
Admission: AD | Disposition: A | Discharge status: Home / Self Care | Payer: Self-pay | Source: Ambulatory Visit | Attending: Obstetrics and Gynecology

## 2014-05-02 DIAGNOSIS — O3663X0 Maternal care for excessive fetal growth, third trimester, not applicable or unspecified: Principal | ICD-10-CM | POA: Diagnosis present

## 2014-05-02 DIAGNOSIS — Z3A38 38 weeks gestation of pregnancy: Secondary | ICD-10-CM | POA: Diagnosis present

## 2014-05-02 DIAGNOSIS — O2686 Pruritic urticarial papules and plaques of pregnancy (PUPPP): Secondary | ICD-10-CM | POA: Diagnosis present

## 2014-05-02 DIAGNOSIS — Z3490 Encounter for supervision of normal pregnancy, unspecified, unspecified trimester: Secondary | ICD-10-CM

## 2014-05-02 HISTORY — DX: Atypical squamous cells of undetermined significance on cytologic smear of cervix (ASC-US): R87.610

## 2014-05-02 HISTORY — DX: Other specified postprocedural states: Z98.890

## 2014-05-02 HISTORY — DX: Breast implant status: Z98.82

## 2014-05-02 LAB — CBC
HEMATOCRIT: 35.5 % — AB (ref 36.0–46.0)
Hemoglobin: 11.8 g/dL — ABNORMAL LOW (ref 12.0–15.0)
MCH: 30.2 pg (ref 26.0–34.0)
MCHC: 33.2 g/dL (ref 30.0–36.0)
MCV: 90.8 fL (ref 78.0–100.0)
Platelets: 194 10*3/uL (ref 150–400)
RBC: 3.91 MIL/uL (ref 3.87–5.11)
RDW: 14.5 % (ref 11.5–15.5)
WBC: 11 10*3/uL — AB (ref 4.0–10.5)

## 2014-05-02 LAB — TYPE AND SCREEN
ABO/RH(D): O POS
ANTIBODY SCREEN: NEGATIVE

## 2014-05-02 SURGERY — Surgical Case
Anesthesia: Spinal | Site: Abdomen

## 2014-05-02 MED ORDER — PHENYLEPHRINE 40 MCG/ML (10ML) SYRINGE FOR IV PUSH (FOR BLOOD PRESSURE SUPPORT)
PREFILLED_SYRINGE | INTRAVENOUS | Status: AC
  Filled 2014-05-02: qty 10

## 2014-05-02 MED ORDER — NALBUPHINE HCL 10 MG/ML IJ SOLN
5.0000 mg | Freq: Once | INTRAMUSCULAR | Status: AC | PRN
  Administered 2014-05-02: 5 mg via SUBCUTANEOUS

## 2014-05-02 MED ORDER — MEPERIDINE HCL 25 MG/ML IJ SOLN: 6.2500 mg | INTRAMUSCULAR | Status: DC | PRN

## 2014-05-02 MED ORDER — FENTANYL CITRATE 0.05 MG/ML IJ SOLN: 25.0000 ug | INTRAMUSCULAR | Status: DC | PRN

## 2014-05-02 MED ORDER — MORPHINE SULFATE (PF) 0.5 MG/ML IJ SOLN
INTRAMUSCULAR | Status: DC | PRN
Start: 2014-05-02 — End: 2014-05-02
  Administered 2014-05-02: .2 mg via INTRATHECAL

## 2014-05-02 MED ORDER — PHENYLEPHRINE 8 MG IN D5W 100 ML (0.08MG/ML) PREMIX OPTIME
INJECTION | INTRAVENOUS | Status: DC | PRN
  Administered 2014-05-02: 60 ug/min via INTRAVENOUS

## 2014-05-02 MED ORDER — CITRIC ACID-SODIUM CITRATE 334-500 MG/5ML PO SOLN
30.0000 mL | Freq: Once | ORAL | Status: AC
  Administered 2014-05-02: 30 mL via ORAL
  Filled 2014-05-02: qty 15

## 2014-05-02 MED ORDER — BUPIVACAINE LIPOSOME 1.3 % IJ SUSP
20.0000 mL | Freq: Once | INTRAMUSCULAR | Status: AC
  Administered 2014-05-02: 20 mL
  Filled 2014-05-02: qty 20

## 2014-05-02 MED ORDER — KETOROLAC TROMETHAMINE 30 MG/ML IJ SOLN: 30.0000 mg | Freq: Four times a day (QID) | INTRAMUSCULAR | Status: DC | PRN

## 2014-05-02 MED ORDER — ONDANSETRON HCL 4 MG/2ML IJ SOLN
INTRAMUSCULAR | Status: DC | PRN
  Administered 2014-05-02: 4 mg via INTRAVENOUS

## 2014-05-02 MED ORDER — PHENYLEPHRINE 8 MG IN D5W 100 ML (0.08MG/ML) PREMIX OPTIME
INJECTION | INTRAVENOUS | Status: AC
  Filled 2014-05-02: qty 100

## 2014-05-02 MED ORDER — HYDROCORTISONE 1 % EX CREA
TOPICAL_CREAM | Freq: Three times a day (TID) | CUTANEOUS | Status: DC | PRN
  Filled 2014-05-02: qty 28

## 2014-05-02 MED ORDER — SCOPOLAMINE 1 MG/3DAYS TD PT72
MEDICATED_PATCH | TRANSDERMAL | Status: AC
  Filled 2014-05-02: qty 1

## 2014-05-02 MED ORDER — OXYTOCIN 10 UNIT/ML IJ SOLN
INTRAMUSCULAR | Status: AC
  Filled 2014-05-02: qty 4

## 2014-05-02 MED ORDER — ACETAMINOPHEN 500 MG PO TABS: 1000.0000 mg | ORAL_TABLET | Freq: Four times a day (QID) | ORAL | Status: DC

## 2014-05-02 MED ORDER — LACTATED RINGERS IV BOLUS (SEPSIS): 1000.0000 mL | Freq: Once | INTRAVENOUS | Status: DC

## 2014-05-02 MED ORDER — ONDANSETRON HCL 4 MG/2ML IJ SOLN: 4.0000 mg | Freq: Once | INTRAMUSCULAR | Status: DC | PRN

## 2014-05-02 MED ORDER — NALBUPHINE HCL 10 MG/ML IJ SOLN: 5.0000 mg | INTRAMUSCULAR | Status: DC | PRN

## 2014-05-02 MED ORDER — LACTATED RINGERS IV SOLN
INTRAVENOUS | Status: DC
  Administered 2014-05-02 (×3): via INTRAVENOUS

## 2014-05-02 MED ORDER — FENTANYL CITRATE 0.05 MG/ML IJ SOLN
INTRAMUSCULAR | Status: DC | PRN
  Administered 2014-05-02: 25 ug via INTRATHECAL

## 2014-05-02 MED ORDER — PHENYLEPHRINE HCL 10 MG/ML IJ SOLN
INTRAMUSCULAR | Status: DC | PRN
  Administered 2014-05-02 (×2): 80 ug via INTRAVENOUS

## 2014-05-02 MED ORDER — DIPHENHYDRAMINE HCL 25 MG PO CAPS
25.0000 mg | ORAL_CAPSULE | ORAL | Status: DC | PRN
  Filled 2014-05-02: qty 1

## 2014-05-02 MED ORDER — BUPIVACAINE IN DEXTROSE 0.75-8.25 % IT SOLN
INTRATHECAL | Status: DC | PRN
  Administered 2014-05-02: 1.6 mL via INTRATHECAL

## 2014-05-02 MED ORDER — SODIUM CHLORIDE 0.9 % IJ SOLN: 3.0000 mL | INTRAMUSCULAR | Status: DC | PRN

## 2014-05-02 MED ORDER — ONDANSETRON HCL 4 MG/2ML IJ SOLN
INTRAMUSCULAR | Status: AC
  Filled 2014-05-02: qty 2

## 2014-05-02 MED ORDER — OXYTOCIN 10 UNIT/ML IJ SOLN
40.0000 [IU] | INTRAVENOUS | Status: DC | PRN
  Administered 2014-05-02: 40 [IU] via INTRAVENOUS

## 2014-05-02 MED ORDER — NALBUPHINE HCL 10 MG/ML IJ SOLN
INTRAMUSCULAR | Status: AC
  Filled 2014-05-02: qty 1

## 2014-05-02 MED ORDER — DEXAMETHASONE SODIUM PHOSPHATE 10 MG/ML IJ SOLN
INTRAMUSCULAR | Status: DC | PRN
  Administered 2014-05-02: 5 mg via INTRAVENOUS

## 2014-05-02 MED ORDER — SODIUM CHLORIDE 0.9 % IJ SOLN
INTRAMUSCULAR | Status: AC
  Filled 2014-05-02: qty 20

## 2014-05-02 MED ORDER — FENTANYL CITRATE 0.05 MG/ML IJ SOLN
INTRAMUSCULAR | Status: AC
  Filled 2014-05-02: qty 2

## 2014-05-02 MED ORDER — FAMOTIDINE IN NACL 20-0.9 MG/50ML-% IV SOLN
20.0000 mg | Freq: Once | INTRAVENOUS | Status: AC
  Administered 2014-05-02: 20 mg via INTRAVENOUS
  Filled 2014-05-02: qty 50

## 2014-05-02 MED ORDER — MORPHINE SULFATE 0.5 MG/ML IJ SOLN
INTRAMUSCULAR | Status: AC
  Filled 2014-05-02: qty 10

## 2014-05-02 MED ORDER — PHENYLEPHRINE 8 MG IN D5W 100 ML (0.08MG/ML) PREMIX OPTIME: INJECTION | INTRAVENOUS | Status: DC | PRN

## 2014-05-02 MED ORDER — NALOXONE HCL 0.4 MG/ML IJ SOLN: 0.4000 mg | INTRAMUSCULAR | Status: DC | PRN

## 2014-05-02 MED ORDER — CEFAZOLIN SODIUM-DEXTROSE 2-3 GM-% IV SOLR
INTRAVENOUS | Status: DC | PRN
  Administered 2014-05-02: 2 g via INTRAVENOUS

## 2014-05-02 MED ORDER — NALBUPHINE HCL 10 MG/ML IJ SOLN: 5.0000 mg | Freq: Once | INTRAMUSCULAR | Status: AC | PRN

## 2014-05-02 MED ORDER — DIPHENHYDRAMINE HCL 50 MG/ML IJ SOLN: 12.5000 mg | INTRAMUSCULAR | Status: DC | PRN

## 2014-05-02 MED ORDER — ONDANSETRON HCL 4 MG/2ML IJ SOLN: 4.0000 mg | Freq: Three times a day (TID) | INTRAMUSCULAR | Status: DC | PRN

## 2014-05-02 MED ORDER — DEXAMETHASONE SODIUM PHOSPHATE 10 MG/ML IJ SOLN
INTRAMUSCULAR | Status: AC
  Filled 2014-05-02: qty 1

## 2014-05-02 MED ORDER — SCOPOLAMINE 1 MG/3DAYS TD PT72
1.0000 | MEDICATED_PATCH | Freq: Once | TRANSDERMAL | Status: DC
  Administered 2014-05-02: 1.5 mg via TRANSDERMAL

## 2014-05-02 MED ORDER — NALOXONE HCL 1 MG/ML IJ SOLN: 1.0000 ug/kg/h | INTRAVENOUS | Status: DC | PRN

## 2014-05-02 MED ORDER — DIPHENHYDRAMINE-ZINC ACETATE 2-0.1 % EX CREA
TOPICAL_CREAM | Freq: Three times a day (TID) | CUTANEOUS | Status: DC | PRN
  Filled 2014-05-02: qty 28

## 2014-05-02 MED ORDER — CEFAZOLIN SODIUM-DEXTROSE 2-3 GM-% IV SOLR
2.0000 g | INTRAVENOUS | Status: DC
  Filled 2014-05-02: qty 50

## 2014-05-02 SURGICAL SUPPLY — 39 items
APL SKNCLS STERI-STRIP NONHPOA (GAUZE/BANDAGES/DRESSINGS) ×1
BENZOIN TINCTURE PRP APPL 2/3 (GAUZE/BANDAGES/DRESSINGS) ×2 IMPLANT
CLAMP CORD UMBIL (MISCELLANEOUS) IMPLANT
CLOSURE WOUND 1/2 X4 (GAUZE/BANDAGES/DRESSINGS) ×1
CLOTH BEACON ORANGE TIMEOUT ST (SAFETY) ×3 IMPLANT
CONTAINER PREFILL 10% NBF 15ML (MISCELLANEOUS) IMPLANT
DECANTER SPIKE VIAL GLASS SM (MISCELLANEOUS) ×2 IMPLANT
DRAPE SHEET LG 3/4 BI-LAMINATE (DRAPES) IMPLANT
DRSG OPSITE POSTOP 4X10 (GAUZE/BANDAGES/DRESSINGS) ×3 IMPLANT
DURAPREP 26ML APPLICATOR (WOUND CARE) ×3 IMPLANT
ELECT REM PT RETURN 9FT ADLT (ELECTROSURGICAL) ×3
ELECTRODE REM PT RTRN 9FT ADLT (ELECTROSURGICAL) ×1 IMPLANT
EXTRACTOR VACUUM KIWI (MISCELLANEOUS) ×2 IMPLANT
EXTRACTOR VACUUM M CUP 4 TUBE (SUCTIONS) IMPLANT
EXTRACTOR VACUUM M CUP 4' TUBE (SUCTIONS)
GLOVE BIO SURGEON STRL SZ7.5 (GLOVE) ×3 IMPLANT
GOWN STRL REUS W/TWL LRG LVL3 (GOWN DISPOSABLE) ×6 IMPLANT
KIT ABG SYR 3ML LUER SLIP (SYRINGE) ×3 IMPLANT
LIQUID BAND (GAUZE/BANDAGES/DRESSINGS) IMPLANT
NDL HYPO 21X1.5 SAFETY (NEEDLE) ×1 IMPLANT
NDL HYPO 25X5/8 SAFETYGLIDE (NEEDLE) ×1 IMPLANT
NEEDLE HYPO 21X1.5 SAFETY (NEEDLE) ×3 IMPLANT
NEEDLE HYPO 25X5/8 SAFETYGLIDE (NEEDLE) ×3 IMPLANT
NS IRRIG 1000ML POUR BTL (IV SOLUTION) ×3 IMPLANT
PACK C SECTION WH (CUSTOM PROCEDURE TRAY) ×3 IMPLANT
PAD OB MATERNITY 4.3X12.25 (PERSONAL CARE ITEMS) ×3 IMPLANT
STAPLER VISISTAT 35W (STAPLE) IMPLANT
STRIP CLOSURE SKIN 1/2X4 (GAUZE/BANDAGES/DRESSINGS) ×1 IMPLANT
SUT MNCRL 0 VIOLET CTX 36 (SUTURE) ×4 IMPLANT
SUT MONOCRYL 0 CTX 36 (SUTURE) ×8
SUT PDS AB 0 CTX 60 (SUTURE) ×3 IMPLANT
SUT PLAIN 0 NONE (SUTURE) IMPLANT
SUT PLAIN 2 0 (SUTURE) ×3
SUT PLAIN 2 0 XLH (SUTURE) IMPLANT
SUT PLAIN ABS 2-0 CT1 27XMFL (SUTURE) IMPLANT
SUT VIC AB 4-0 KS 27 (SUTURE) ×3 IMPLANT
SYR 20CC LL (SYRINGE) ×3 IMPLANT
TOWEL OR 17X24 6PK STRL BLUE (TOWEL DISPOSABLE) ×3 IMPLANT
TRAY FOLEY CATH 14FR (SET/KITS/TRAYS/PACK) ×3 IMPLANT

## 2014-05-02 NOTE — Anesthesia Procedure Notes (Addendum)
Spinal   Spinal Patient location during procedure: OB Staffing Anesthesiologist: Riyad Keena, CHRIS Preanesthetic Checklist Completed: patient identified, surgical consent, pre-op evaluation, timeout performed, IV checked, risks and benefits discussed and monitors and equipment checked Spinal Block Patient position: sitting Prep: site prepped and draped and DuraPrep Patient monitoring: heart rate, cardiac monitor, continuous pulse ox and blood pressure Approach: midline Location: L4-5 Injection technique: single-shot Needle Needle type: Pencan  Needle gauge: 24 G Needle length: 10 cm Assessment Sensory level: T4

## 2014-05-02 NOTE — MAU Note (Signed)
Last night was having regular contractions.  Had regular appt today; Dr Vincente PoliGrewal checked her and she was 4cm  With a bulging bag of water.  EFW is 10.6#.  Is still contracting, not painful or regular  like last night. Sent over to consult with Dr Henderson Cloudomblin, ? Augment or c/s

## 2014-05-02 NOTE — H&P (Signed)
Meredith Gillespie is a 34 y.o. female presenting for UCs that were bad last night. Now recurring getting hard. Today in office cx is 3-4 cm per Dr Meredith Gillespie (was 2cm) with regular UCs on monitor. U/S today shows EFW @ 10# 6 oz with AC off the chart. 1 hour GTT normal and additional FBS normal. No ROM, no HA, no vision change, no epigastric pain. Patient also has very itchy rash on abdomen and upper legs treated as PUPPS. Maternal Medical History:  Reason for admission: Contractions.   Fetal activity: Perceived fetal activity is normal.      OB History    Gravida Para Term Preterm AB TAB SAB Ectopic Multiple Living   2 1        1      Past Medical History  Diagnosis Date  . Chronic kidney disease     kidney stones  . Kidney stones    Past Surgical History  Procedure Laterality Date  . Breast enhancement surgery    . Childbirth  2010  . Lithotripsy  2000, 2010   Family History: family history includes Cancer in her maternal aunt. Social History:  reports that she has never smoked. She does not have any smokeless tobacco history on file. She reports that she drinks alcohol. She reports that she does not use illicit drugs.   Prenatal Transfer Tool  Maternal Diabetes: No Genetic Screening: Normal Maternal Ultrasounds/Referrals: Normal Fetal Ultrasounds or other Referrals:  None Maternal Substance Abuse:  No Significant Maternal Medications:  None Significant Maternal Lab Results:  None Other Comments:  None  Review of Systems  Eyes: Negative for blurred vision.  Gastrointestinal: Negative for abdominal pain.  Neurological: Negative for headaches.      Blood pressure 123/62, pulse 92, temperature 98.5 F (36.9 C), temperature source Oral, resp. rate 18, weight 183 lb 6.4 oz (83.19 kg), SpO2 100 %. Maternal Exam:  Uterine Assessment: Contraction strength is firm.  Contraction frequency is regular.   Abdomen: Patient reports no abdominal tenderness.   Fetal Exam Fetal State  Assessment: Category I - tracings are normal.     Physical Exam  Cardiovascular: Normal rate and regular rhythm.   Respiratory: Effort normal and breath sounds normal.  GI: Soft. There is no tenderness.  Neurological: She has normal reflexes.    Prenatal labs: ABO, Rh: O/Positive/-- (06/23 0000) Antibody: Negative (06/23 0000) Rubella: Immune (06/23 0000) RPR: Nonreactive (06/23 0000)  HBsAg: Negative (06/23 0000)  HIV: Non-reactive (06/23 0000)  GBS: Negative (12/31 0000)   Assessment/Plan: 34 yo G2P1 @ 38 6/7 weeks entering active labor D/W patient and husband increased risk, but not certainty, of shoulder dystocia. D/W possible outcomes with shoulder dystocia including Erb's palsy or fetal death or other severe disabilities. Recommend cesarean section. D/W risks including but not limited to infection, organ damage, bleeding/transfusion-HIV/Hep, DVT/PE, pneumonia, return to OR, wound breakdown, pain. All questions answered. Patient and husband state they understand and agree.   Meredith Gillespie,Meredith Gillespie 05/02/2014, 7:19 PM

## 2014-05-02 NOTE — Transfer of Care (Signed)
Immediate Anesthesia Transfer of Care Note  Patient: Meredith Gillespie  Procedure(s) Performed: Procedure(s): CESAREAN SECTION (N/A)  Patient Location: PACU  Anesthesia Type:Spinal  Level of Consciousness: awake, alert  and oriented  Airway & Oxygen Therapy: Patient Spontanous Breathing  Post-op Assessment: Report given to PACU RN and Post -op Vital signs reviewed and stable  Post vital signs: Reviewed and stable  Complications: No apparent anesthesia complications

## 2014-05-02 NOTE — Anesthesia Preprocedure Evaluation (Signed)
Anesthesia Evaluation  Patient identified by MRN, date of birth, ID band Patient awake    Reviewed: Allergy & Precautions, NPO status , Patient's Chart, lab work & pertinent test results  History of Anesthesia Complications Negative for: history of anesthetic complications  Airway Mallampati: II  TM Distance: >3 FB Neck ROM: Full    Dental no notable dental hx. (+) Dental Advisory Given   Pulmonary neg pulmonary ROS,  breath sounds clear to auscultation  Pulmonary exam normal       Cardiovascular negative cardio ROS  Rhythm:Regular Rate:Normal     Neuro/Psych negative neurological ROS  negative psych ROS   GI/Hepatic negative GI ROS, Neg liver ROS,   Endo/Other  negative endocrine ROS  Renal/GU negative Renal ROS  negative genitourinary   Musculoskeletal negative musculoskeletal ROS (+)   Abdominal   Peds negative pediatric ROS (+)  Hematology negative hematology ROS (+)   Anesthesia Other Findings   Reproductive/Obstetrics (+) Pregnancy                             Anesthesia Physical Anesthesia Plan  ASA: II  Anesthesia Plan: Spinal   Post-op Pain Management:    Induction:   Airway Management Planned:   Additional Equipment:   Intra-op Plan:   Post-operative Plan:   Informed Consent: I have reviewed the patients History and Physical, chart, labs and discussed the procedure including the risks, benefits and alternatives for the proposed anesthesia with the patient or authorized representative who has indicated his/her understanding and acceptance.   Dental advisory given  Plan Discussed with: CRNA  Anesthesia Plan Comments: (Discussed with Dr. Henderson Cloudomblin. Patient with macrosomia, painful contractions, worrying about when she may break her water, in active labor. Requests indicated C/S with time to obtain labs but goal to be back in the OR within 1-1.5 hours. Will give  pepcid and bictra for GI ppx. Discussed spinal anesthetic with patient and husband. )        Anesthesia Quick Evaluation

## 2014-05-02 NOTE — MAU Note (Signed)
Dr. Henderson Cloudomblin in discussing C/S with pt & her husband.

## 2014-05-02 NOTE — Brief Op Note (Signed)
05/02/2014  9:51 PM  PATIENT:  Meredith Gillespie  34 y.o. female  PRE-OPERATIVE DIAGNOSIS:  fetal macrosomia  POST-OPERATIVE DIAGNOSIS:  fetal macrosomia  PROCEDURE:  Procedure(s): CESAREAN SECTION (N/A)  SURGEON:  Surgeon(s) and Role:    * Leslie AndreaJames E Ariana Juul II, MD - Primary  PHYSICIAN ASSISTANT:   ASSISTANTS: none   ANESTHESIA:   spinal  EBL:  Total I/O In: 1600 [I.V.:1600] Out: 1000 [Urine:200; Blood:800]  BLOOD ADMINISTERED:none  DRAINS: Urinary Catheter (Foley)   LOCAL MEDICATIONS USED:  OTHER exaprel 20 ml in saline 20 ml  SPECIMEN:  Source of Specimen:  placenta  DISPOSITION OF SPECIMEN:  PATHOLOGY  COUNTS:  YES  TOURNIQUET:  * No tourniquets in log *  DICTATION: .Other Dictation: Dictation Number [id number]  PLAN OF CARE: Admit to inpatient   PATIENT DISPOSITION:  PACU - hemodynamically stable.   Delay start of Pharmacological VTE agent (>24hrs) due to surgical blood loss or risk of bleeding: not applicable

## 2014-05-03 ENCOUNTER — Encounter (HOSPITAL_COMMUNITY): Payer: Self-pay | Admitting: *Deleted

## 2014-05-03 LAB — CBC
HEMATOCRIT: 28.8 % — AB (ref 36.0–46.0)
Hemoglobin: 9.9 g/dL — ABNORMAL LOW (ref 12.0–15.0)
MCH: 31.2 pg (ref 26.0–34.0)
MCHC: 34.4 g/dL (ref 30.0–36.0)
MCV: 90.9 fL (ref 78.0–100.0)
Platelets: 156 10*3/uL (ref 150–400)
RBC: 3.17 MIL/uL — ABNORMAL LOW (ref 3.87–5.11)
RDW: 14.3 % (ref 11.5–15.5)
WBC: 16.8 10*3/uL — ABNORMAL HIGH (ref 4.0–10.5)

## 2014-05-03 LAB — ABO/RH: ABO/RH(D): O POS

## 2014-05-03 MED ORDER — DIBUCAINE 1 % RE OINT: 1.0000 "application " | TOPICAL_OINTMENT | RECTAL | Status: DC | PRN

## 2014-05-03 MED ORDER — OXYTOCIN 40 UNITS IN LACTATED RINGERS INFUSION - SIMPLE MED: 62.5000 mL/h | INTRAVENOUS | Status: AC

## 2014-05-03 MED ORDER — TETANUS-DIPHTH-ACELL PERTUSSIS 5-2.5-18.5 LF-MCG/0.5 IM SUSP: 0.5000 mL | Freq: Once | INTRAMUSCULAR | Status: DC

## 2014-05-03 MED ORDER — PRENATAL MULTIVITAMIN CH
1.0000 | ORAL_TABLET | Freq: Every day | ORAL | Status: DC
  Administered 2014-05-04: 1 via ORAL
  Filled 2014-05-03: qty 1

## 2014-05-03 MED ORDER — LANOLIN HYDROUS EX OINT: 1.0000 "application " | TOPICAL_OINTMENT | CUTANEOUS | Status: DC | PRN

## 2014-05-03 MED ORDER — OXYCODONE-ACETAMINOPHEN 5-325 MG PO TABS: 2.0000 | ORAL_TABLET | ORAL | Status: DC | PRN

## 2014-05-03 MED ORDER — OXYCODONE-ACETAMINOPHEN 5-325 MG PO TABS: 1.0000 | ORAL_TABLET | ORAL | Status: DC | PRN

## 2014-05-03 MED ORDER — ZOLPIDEM TARTRATE 5 MG PO TABS: 5.0000 mg | ORAL_TABLET | Freq: Every evening | ORAL | Status: DC | PRN

## 2014-05-03 MED ORDER — ONDANSETRON HCL 4 MG/2ML IJ SOLN: 4.0000 mg | INTRAMUSCULAR | Status: DC | PRN

## 2014-05-03 MED ORDER — WITCH HAZEL-GLYCERIN EX PADS: 1.0000 "application " | MEDICATED_PAD | CUTANEOUS | Status: DC | PRN

## 2014-05-03 MED ORDER — LORATADINE 10 MG PO TABS
10.0000 mg | ORAL_TABLET | Freq: Every day | ORAL | Status: DC
  Administered 2014-05-04: 10 mg via ORAL
  Filled 2014-05-03: qty 1

## 2014-05-03 MED ORDER — LACTATED RINGERS IV SOLN
INTRAVENOUS | Status: DC
  Administered 2014-05-03: 05:00:00 via INTRAVENOUS

## 2014-05-03 MED ORDER — DIPHENHYDRAMINE HCL 25 MG PO CAPS
25.0000 mg | ORAL_CAPSULE | Freq: Four times a day (QID) | ORAL | Status: DC | PRN
  Administered 2014-05-03 (×3): 25 mg via ORAL
  Filled 2014-05-03 (×3): qty 1

## 2014-05-03 MED ORDER — ONDANSETRON HCL 4 MG PO TABS: 4.0000 mg | ORAL_TABLET | ORAL | Status: DC | PRN

## 2014-05-03 MED ORDER — SIMETHICONE 80 MG PO CHEW
80.0000 mg | CHEWABLE_TABLET | Freq: Three times a day (TID) | ORAL | Status: DC
  Administered 2014-05-04: 80 mg via ORAL
  Filled 2014-05-03 (×2): qty 1

## 2014-05-03 MED ORDER — IBUPROFEN 600 MG PO TABS
600.0000 mg | ORAL_TABLET | Freq: Four times a day (QID) | ORAL | Status: DC
  Administered 2014-05-03 – 2014-05-04 (×6): 600 mg via ORAL
  Filled 2014-05-03 (×6): qty 1

## 2014-05-03 MED ORDER — SIMETHICONE 80 MG PO CHEW
80.0000 mg | CHEWABLE_TABLET | ORAL | Status: DC
  Administered 2014-05-03: 80 mg via ORAL
  Filled 2014-05-03: qty 1

## 2014-05-03 MED ORDER — SIMETHICONE 80 MG PO CHEW: 80.0000 mg | CHEWABLE_TABLET | ORAL | Status: DC | PRN

## 2014-05-03 MED ORDER — SENNOSIDES-DOCUSATE SODIUM 8.6-50 MG PO TABS
2.0000 | ORAL_TABLET | ORAL | Status: DC
  Administered 2014-05-03: 2 via ORAL
  Filled 2014-05-03: qty 2

## 2014-05-03 MED ORDER — MENTHOL 3 MG MT LOZG
1.0000 | LOZENGE | OROMUCOSAL | Status: DC | PRN
  Administered 2014-05-04 (×2): 3 mg via ORAL
  Filled 2014-05-03: qty 9

## 2014-05-03 NOTE — Op Note (Signed)
NAMEWALAA, CAREL               ACCOUNT NO.:  [id number]  MEDICAL RECORD NO.:  [id number]  LOCATION:  9143                          FACILITY:  [facility]  PHYSICIAN:  Guy Sandifer. Henderson Cloud, M.D. DATE OF BIRTH:  Jun 11, 1980  DATE OF PROCEDURE:  05/02/2014 DATE OF DISCHARGE:                              OPERATIVE REPORT   PREOPERATIVE DIAGNOSES: 1. Intrauterine pregnancy at 38-6/7th weeks. 2. Fetal macrosomia. 3. Labor.  POSTOPERATIVE DIAGNOSES: 1. Intrauterine pregnancy at 38-6/7th weeks. 2. Fetal macrosomia. 3. Labor.  PROCEDURE:  Primary low-transverse cesarean section.  SURGEON:  Guy Sandifer. Henderson Cloud, M.D.  ANESTHESIA:  Spinal.  ESTIMATED BLOOD LOSS:  700 mL.  FINDINGS:  Viable female infant, Apgars of 9 and 9 at 1 and 5 minutes respectively.  Birth weight 10 pounds and 2 ounces.  Arterial cord pH pending.  SPECIMENS:  Placenta to Pathology.  INDICATIONS AND CONSENT:  This patient is a 34 year old, G2, P1, at 57- 6/7th weeks.  Ultrasound today was consistent with 10 pounds and 6 ounces.  She has a history of a 7 pounds and 6 ounce baby, delivered vaginally.  Diabetes screen and subsequent fasting blood sugars have been normal with this pregnancy.  Also on ultrasound today, the abdominal circumference was off the chart and the amniotic fluid index was 95th percentile or higher.  Careful discussion with the patient and her husband was undertaken considering risk of shoulder dystocia and possible sequelae.  Also discussed was cesarean section with its potential complications, including, but not limited to, infection, organ damage, bleeding requiring transfusion of blood products with HIV and hepatitis acquisition, DVT, PE, pneumonia, pain, return to the operating room, wound break down.  All questions have been answered.  Consent was signed on the chart.  The patient states she understands and agrees to cesarean section.  DESCRIPTION OF PROCEDURE:  The patient was taken to the  operating room where she was identified.  Spinal anesthetic was placed per Anesthesia and she was placed in the dorsal supine position with 15-degree left lateral wedge.  Foley catheter was placed in the drain.  She was prepped and draped in sterile fashion per [facility] protocol.  Time-out was undertaken.  After testing for adequate spinal anesthesia, skin was entered through the Pfannenstiel incision.  Dissection was carried out in layers of the peritoneum, which was incised, and extended superiorly and inferiorly.  Vesicouterine peritoneum was taken down cephalad laterally.  The bladder flap was developed and the bladder blade was placed.  Uterus was incised in a low-transverse manner.  The uterine cavity was entered bluntly with a hemostat.  Clear fluid was noted.  The uterine incision was extended cephalad laterally with the fingers.  The vertex was elevated through the incision with the aid of a Kiwi vacuum extractor without difficulty.  Remainder of the baby was then delivered without difficulty.  Cord was clamped and cut, and the baby was handed to the awaiting pediatrics team.  Placenta was delivered without difficulty.  Thorough inspection revealed the cavity to be clean. Uterus was closed in two running locking imbricating layers of 0 Monocryl suture, which achieves good hemostasis.  Anterior peritoneum was closed in a running fashion with  0 Monocryl suture, which was also used to reapproximate the pyramidalis muscle in the midline.  Anterior rectus fascia was closed in a running fashion with a 0 looped PDS suture.  After obtaining good hemostasis, 20 mL of Exparel dissolved and 20 mL of saline was injected both subfascially and subcutaneously. Subcutaneous layer was then closed with interrupted plain suture and the skin was closed in a subcuticular fashion with 0 Vicryl on a Keith needle.  Steri-Strips and dressing were applied.  All counts were correct.  The patient was  taken to the recovery room in stable condition.     Guy SandiferJames E. Henderson Cloudomblin, M.D.     JET/MEDQ  D:  05/02/2014  T:  05/03/2014  Job:  [id number]

## 2014-05-03 NOTE — Lactation Note (Signed)
This note was copied from the chart of [name]. Lactation Consultation Note Follow up visit at 26 hours of age.  Baby is at the breast STS fussy and arching back.  Attempted hand expression only a small drop visible.  Attempted latch baby refused and screamed.  Attempted oral assessment with gloved finger, baby gassy and burping no good suck established.  Baby does not calm on mom's chest.  Diaper changed void and large meconium.  Baby burped and comforted and setteled some.  Attempted latch again, baby does not aggressively suck and presumed to not be hungry.  Mom reports baby has been on and off breast for several hours and fussy.  Mom reports baby had 2nd dose of Tylenol.  Due to baby not breast feeding well and mom breast augmentation surgery with history of low milk supply suggested DEBP, mom agrees.  Set up with instructions on preemie setting every 3 hours and to hand express after pumping.  Discussed set up and cleaning.  Mom did not collect any colostrum and MBU RN at bedside to do weight check.  Baby has lost 6% in first 24 hours of life.  Although baby does not show interest in feeding at this time.  Mom mentions she previously used SNS.  Brought to mom alimentum formula and foley cup to offer feeding to baby if he awakens before next LC can visit.  Report given to night LC that mom may call for assist with SNS for formula supplementation.  Mom to call for assist as needed.      Patient Name: [name] BJYNW'GToday's Date: 05/03/2014 Reason for consult: Follow-up assessment;Difficult latch;Other (Comment);Breast surgery   Maternal Data Has patient been taught Hand Expression?: Yes Does the patient have breastfeeding experience prior to this delivery?: Yes  Feeding Feeding Type: Breast Fed Length of feed: 20 min  LATCH Score/Interventions                      Lactation Tools Discussed/Used Pump Review: Setup, frequency, and cleaning Initiated by:: JS Date  initiated:: 05/03/14   Consult Status Consult Status: Follow-up Date: 05/04/14 Follow-up type: In-patient    Meredith Gillespie, Meredith Gillespie 05/03/2014, 11:36 PM

## 2014-05-03 NOTE — Addendum Note (Signed)
Addendum  created 05/03/14 0825 by Renford DillsJanet L Kaelin Bonelli, CRNA   Modules edited: Notes Section   Notes Section:  File: [id number]

## 2014-05-03 NOTE — Progress Notes (Signed)
Subjective: Postpartum Day 1: Cesarean Delivery Patient reports incisional pain and tolerating PO.    Objective: Vital signs in last 24 hours: Temp:  [97.9 F (36.6 C)-99.5 F (37.5 C)] 98.3 F (36.8 C) (01/20 0417) Pulse Rate:  [69-110] 110 (01/20 0417) Resp:  [16-35] 20 (01/20 0417) BP: (83-155)/(52-94) 94/58 mmHg (01/20 0417) SpO2:  [95 %-100 %] 95 % (01/20 0100) Weight:  [83.19 kg (183 lb 6.4 oz)] 83.19 kg (183 lb 6.4 oz) (01/19 1813)  Physical Exam:  General: alert, cooperative and appears stated age Lochia: appropriate Uterine Fundus: firm Incision: healing well, no significant drainage, no dehiscence DVT Evaluation: No evidence of DVT seen on physical exam.   Recent Labs  05/02/14 1930 05/03/14 0545  HGB 11.8* 9.9*  HCT 35.5* 28.8*    Assessment/Plan: Status post Cesarean section. Doing well postoperatively.  Continue current care circ later today.  Loyda Costin L 05/03/2014, 8:11 AM

## 2014-05-03 NOTE — Anesthesia Postprocedure Evaluation (Signed)
  Anesthesia Post-op Note  Patient: Meredith Gillespie  Procedure(s) Performed: Procedure(s): CESAREAN SECTION (N/A)  Patient Location: Mother/Baby  Anesthesia Type:Spinal  Level of Consciousness: awake  Airway and Oxygen Therapy: Patient Spontanous Breathing  Post-op Pain: mild  Post-op Assessment: Patient's Cardiovascular Status Stable and Respiratory Function Stable  Post-op Vital Signs: stable  Last Vitals:  Filed Vitals:   05/03/14 0417  BP: 94/58  Pulse: 110  Temp: 36.8 C  Resp: 20    Complications: No apparent anesthesia complications

## 2014-05-03 NOTE — Anesthesia Postprocedure Evaluation (Signed)
  Anesthesia Post-op Note  Patient: Meredith Gillespie  Procedure(s) Performed: Procedure(s): CESAREAN SECTION (N/A)  Patient Location: PACU  Anesthesia Type:Spinal  Level of Consciousness: awake  Airway and Oxygen Therapy: Patient Spontanous Breathing  Post-op Pain: mild  Post-op Assessment: Post-op Vital signs reviewed, Patient's Cardiovascular Status Stable, Respiratory Function Stable, Patent Airway, No signs of Nausea or vomiting and Pain level controlled  Post-op Vital Signs: Reviewed and stable  Last Vitals:  Filed Vitals:   05/03/14 0100  BP: 110/60  Pulse: 79  Temp: 36.6 C  Resp: 18    Complications: No apparent anesthesia complications

## 2014-05-04 DIAGNOSIS — Z3490 Encounter for supervision of normal pregnancy, unspecified, unspecified trimester: Secondary | ICD-10-CM

## 2014-05-04 LAB — RPR: RPR Ser Ql: NONREACTIVE

## 2014-05-04 MED ORDER — IBUPROFEN 600 MG PO TABS: 600.0000 mg | ORAL_TABLET | Freq: Four times a day (QID) | ORAL | Status: AC

## 2014-05-04 MED ORDER — HYDROCORTISONE 1 % EX CREA: TOPICAL_CREAM | Freq: Three times a day (TID) | CUTANEOUS | Status: AC | PRN

## 2014-05-04 MED ORDER — OXYCODONE-ACETAMINOPHEN 5-325 MG PO TABS: 1.0000 | ORAL_TABLET | ORAL | Status: AC | PRN

## 2014-05-04 MED ORDER — NALBUPHINE HCL 10 MG/ML IJ SOLN
10.0000 mg | Freq: Once | INTRAMUSCULAR | Status: AC
  Administered 2014-05-04: 10 mg via INTRAMUSCULAR
  Filled 2014-05-04: qty 1

## 2014-05-04 NOTE — Progress Notes (Signed)
Subjective: Postpartum Day 2: Cesarean Delivery Patient reports incisional pain, tolerating PO, + flatus and no problems voiding.    Objective: Vital signs in last 24 hours: Temp:  [97.8 F (36.6 C)] 97.8 F (36.6 C) (01/21 0617) Pulse Rate:  [72] 72 (01/21 0617) Resp:  [16] 16 (01/21 0617) BP: (103)/(54) 103/54 mmHg (01/21 0617)  Physical Exam:  General: alert and cooperative Lochia: appropriate Uterine Fundus: firm Incision: small drainage noted on bandage DVT Evaluation: No evidence of DVT seen on physical exam. Negative Homan's sign. No cords or calf tenderness. No significant calf/ankle edema.   Recent Labs  05/02/14 1930 05/03/14 0545  HGB 11.8* 9.9*  HCT 35.5* 28.8*    Assessment/Plan: Status post Cesarean section. Doing well postoperatively.  Discharge home with standard precautions and return to clinic in 1 week.  CURTIS,CAROL G 05/04/2014, 9:02 AM

## 2014-05-04 NOTE — Lactation Note (Signed)
This note was copied from the chart of Meredith Carma Leavenshley Younts. Lactation Consultation Note  Patient Name: Meredith Gillespie ZOXWR'UToday's Date: 05/04/2014 Reason for consult: Follow-up assessment  Baby is 6139 hours old and planning for D/c today. LC 11-7 worked with mom with NS , and SNS , and pe rmom latching is improving. Per mom with her 1st baby used the SNS with success. @ consult reviewed plan of care - with  The use of a Nipple shield and SNS ( starter )  Per mom feels comfortable with the starter SNS , so LC gave mom to more starter SNS kits, ( 24 hours use on each)  Also sized mom for the #24 NS for when the #20 NS becomes to small , and areola is more compressible. Prior to the baby latching - mom hand expressed , milk noted, mom prefers to place the SNS 1st and then apply the NS  , and ;latch. LC set up the SNS with 20 ml of formula . Baby latched with multiply swallows , on and off at 1st and when he finally got  Settled down and was in a sucking pattern , swallows noted and bulbs in the SNS container which is a signal the baby is drawing from the SNS . Baby took 10 ml from the SNS .  Per mom [facility] the 1st breast with NS and SNS , and the post pump both breast for 10 -15 mins . Next feeding feed on the 2nd  Breast with SNS, NS , and post pump . Watch for consistent swallowing pattern. LC also mentioned to mom is the baby is to fussy to latch , give an appetizer of EBM or formula with a medium based nipple  Bottle ( Medela or Dr. Manson PasseyBrown ) , so the baby is calm going to the breast, therefore mom also will feel calmer with latch. F/U apt for next Friday January 29 th at 10 30 , apt reminder slip given to mom.  Mother informed of post-discharge support and given phone number to the lactation department, including services for phone call assistance; out-patient appointments; and breastfeeding support group. List of other breastfeeding resources in the  community given in the handout. Encouraged mother to call for problems or concerns related to breastfeeding.     Maternal Data Has patient been taught Hand Expression?: Yes  Feeding Feeding Type: Breast Fed  LATCH Score/Interventions                Intervention(s): Breastfeeding basics reviewed     Lactation Tools Discussed/Used Tools: Supplemental Nutrition System   Consult Status Consult Status: Follow-up Date: 05/04/14 Follow-up type: In-patient    Kathrin Greathouseorio, Iwalani Templeton Ann 05/04/2014, 12:36 PM

## 2014-05-04 NOTE — Lactation Note (Signed)
This note was copied from the chart of [name]. Lactation Consultation Note Mom emotional, tearful at times, stating feels defeated. Itching, frustrated with that. Baby not wanting to eat well at breast. Mom had breast augmentation behind muscle, cut at underside of nipple. Unable to hand express anything at this time. Mom has DEBP and is stimulating breast. Encouraged to post-pump to stimulate breast, reviewed supply and demand. Mom requested SNS so baby would get supplementing and stimulating breast. Set up and cleaning reviewed and demonstrated. D/t short shafts nipples, recessed chin, upper lip frenulum, and probable tongue frenulum. Baby has been very spitty and disinteresed at breast. Abd. Slightly distended. Discussed positioning, relaxation, supply and demand. Spouse at bedside and supportive.  Patient Name: [name] QMVHQ'IToday's Date: 05/04/2014 Reason for consult: Follow-up assessment;Difficult latch   Maternal Data    Feeding Feeding Type: Formula Length of feed: 15 min  LATCH Score/Interventions Latch: Grasps breast easily, tongue down, lips flanged, rhythmical sucking. Intervention(s): Adjust position;Assist with latch;Breast massage;Breast compression  Audible Swallowing: Spontaneous and intermittent Intervention(s): Skin to skin;Hand expression Intervention(s): Alternate breast massage  Type of Nipple: Everted at rest and after stimulation  Comfort (Breast/Nipple): Soft / non-tender     Hold (Positioning): Assistance needed to correctly position infant at breast and maintain latch. Intervention(s): Breastfeeding basics reviewed;Support Pillows;Position options;Skin to skin  LATCH Score: 9  Lactation Tools Discussed/Used Tools: Shells;Pump;Nipple [name];Supplemental Nutrition System Nipple shield size: 20 Shell Type: Inverted Breast pump type: Double-Electric Breast Pump Pump Review: Setup, frequency, and cleaning;Milk Storage   Consult  Status Consult Status: Follow-up Date: 05/05/14 Follow-up type: In-patient    Charyl DancerCARVER, [name] 05/04/2014, 4:51 AM

## 2014-05-04 NOTE — Discharge Summary (Signed)
Obstetric Discharge Summary Reason for Admission: cesarean section Prenatal Procedures: ultrasound Intrapartum Procedures: cesarean: low cervical, transverse Postpartum Procedures: none Complications-Operative and Postpartum: none HEMOGLOBIN  Date Value Ref Range Status  05/03/2014 9.9* 12.0 - 15.0 g/dL Final   HCT  Date Value Ref Range Status  05/03/2014 28.8* 36.0 - 46.0 % Final    Physical Exam:  General: alert and cooperative Lochia: appropriate Uterine Fundus: firm Incision: small drainage noted on bandage DVT Evaluation: No evidence of DVT seen on physical exam. Negative Homan's sign. No cords or calf tenderness. No significant calf/ankle edema.  Discharge Diagnoses: Term Pregnancy-delivered  Discharge Information: Date: 05/04/2014 Activity: pelvic rest Diet: routine Medications: PNV, Ibuprofen, Percocet and topical steroids for PUPPS Condition: stable Instructions: refer to practice specific booklet Discharge to: home   Newborn Data: Live born female  Birth Weight: 10 lb 2.3 oz (4601 g) APGAR: 9, 9  Home with mother.  CURTIS,CAROL G 05/04/2014, 9:10 AM

## 2014-05-12 ENCOUNTER — Ambulatory Visit (HOSPITAL_COMMUNITY): Payer: Managed Care, Other (non HMO)

## 2014-06-14 ENCOUNTER — Other Ambulatory Visit: Payer: Self-pay | Admitting: Obstetrics and Gynecology

## 2014-06-15 LAB — CYTOLOGY - PAP

## 2015-02-19 ENCOUNTER — Ambulatory Visit
Admission: RE | Admit: 2015-02-19 | Discharge: 2015-02-19 | Disposition: A | Discharge status: Home / Self Care | Payer: BLUE CROSS/BLUE SHIELD | Source: Ambulatory Visit | Attending: Nurse Practitioner | Admitting: Nurse Practitioner

## 2015-02-19 ENCOUNTER — Other Ambulatory Visit: Payer: Self-pay | Admitting: Nurse Practitioner

## 2015-02-19 DIAGNOSIS — N6459 Other signs and symptoms in breast: Secondary | ICD-10-CM

## 2017-07-03 ENCOUNTER — Other Ambulatory Visit (HOSPITAL_COMMUNITY): Payer: Self-pay | Admitting: *Deleted

## 2017-07-03 MED ORDER — AZITHROMYCIN 250 MG PO TABS: ORAL_TABLET | ORAL | 0 refills | Status: AC
